# Patient Record
Sex: Female | Born: 1985 | Race: Black or African American | Hispanic: No | Marital: Single
Health system: Midwestern US, Academic
[De-identification: ages and names within clinical notes are randomized; demographics above are authoritative.]

## PROBLEM LIST (undated history)

## (undated) LAB — HIV 1+2 ANTIBODY/ANTIGEN WITH REFLEX: HIV 1+2 AB/AGN: NEGATIVE

---

## 2011-04-06 IMAGING — US US TRANSVAGINAL NON-OB
1 series · 14 of 25 positions shown · non-contrast
Comparison: 06/12/2008 obstetric ultrasound

CLINICAL DATA: Abdominal and pelvic pain, fever, vaginal bleeding,
status post miscarriage

TRANSABDOMINAL AND TRANSVAGINAL ULTRASOUND OF PELVIS
TECHNIQUE: Both transabdominal and transvaginal ultrasound
examinations of the pelvis were performed including evaluation of
the uterus, ovaries, adnexal regions, and pelvic cul-de-sac.

[Series 1: us pelvis complete modify · 14 of 40 slices shown]
[im 1/40]
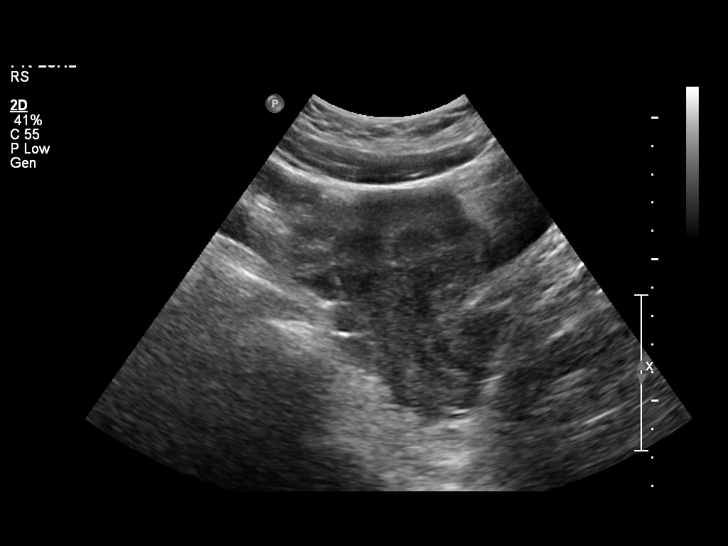
[im 4/40]
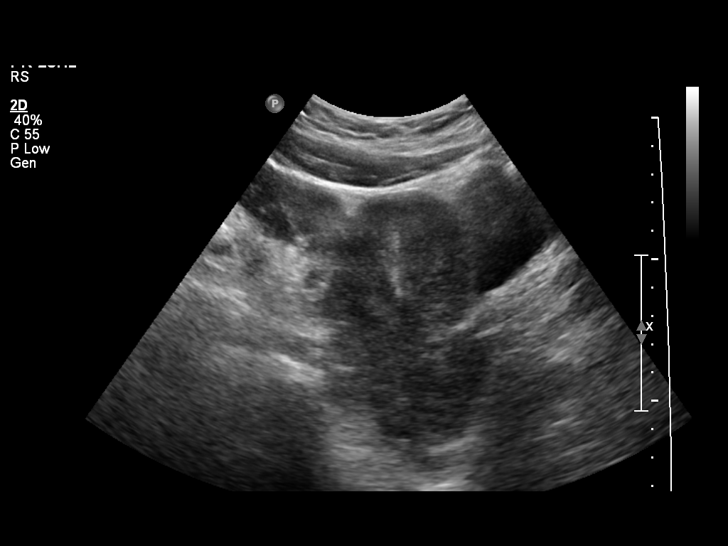
[im 7/40]
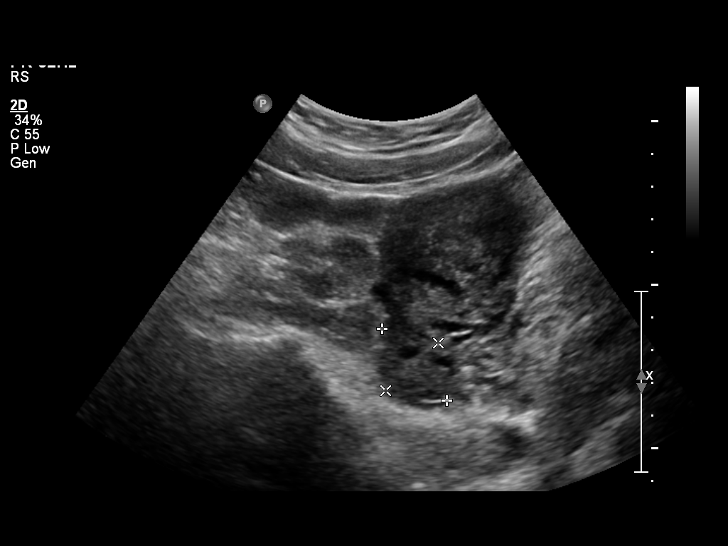
[im 10/40]
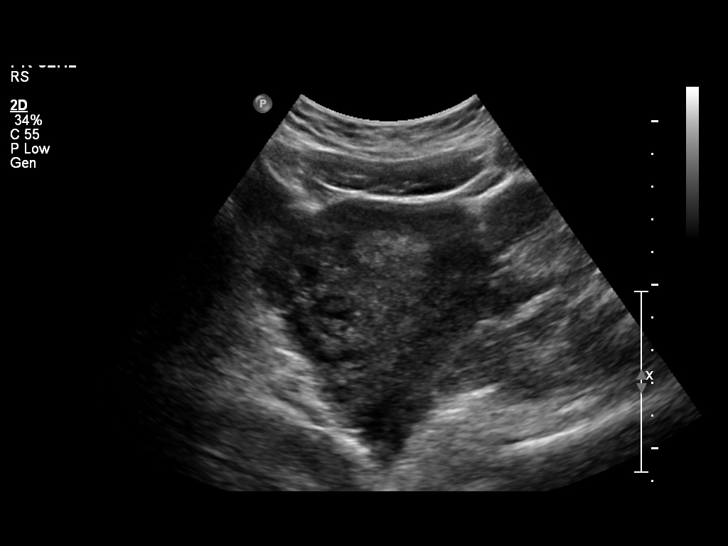
[im 14/40]
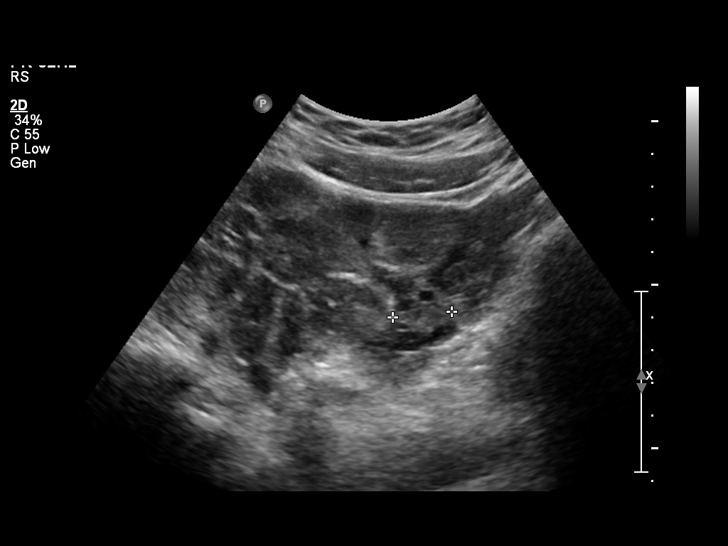
[im 15/40]
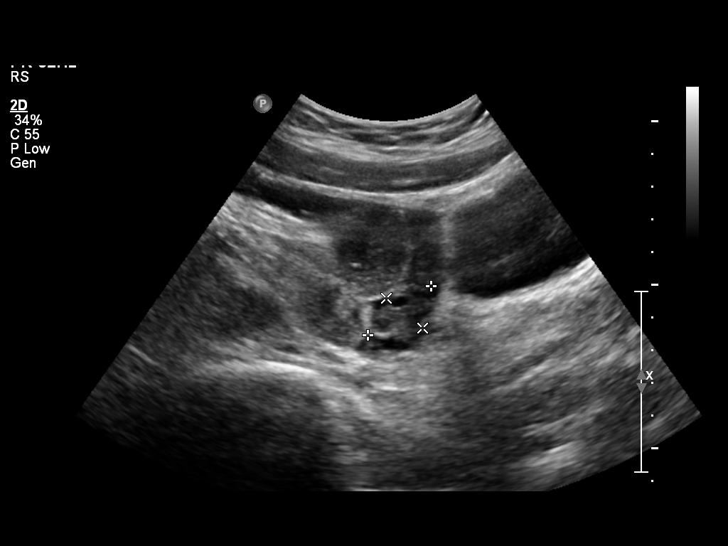
[im 18/40]
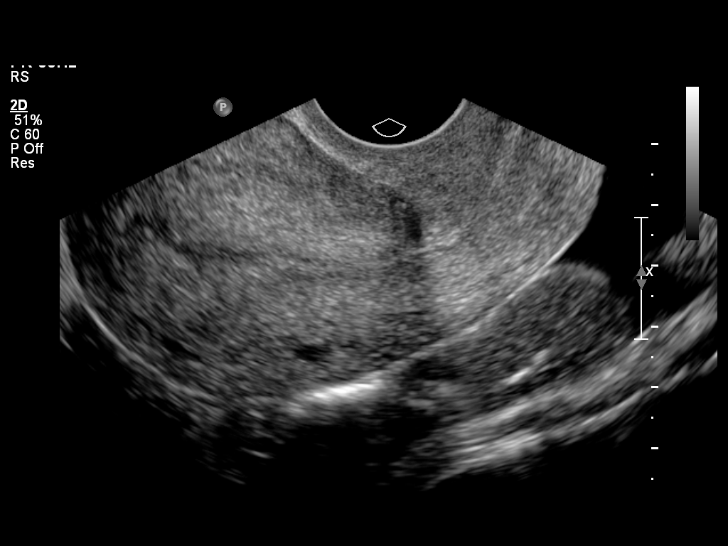
[im 22/40]
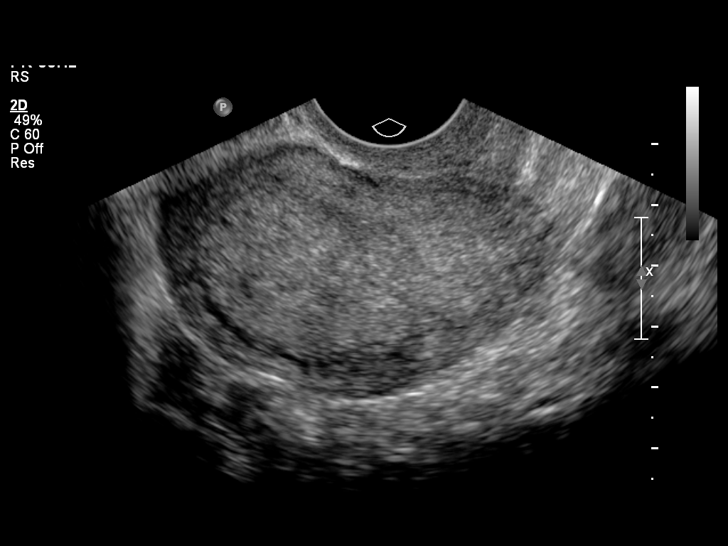
[im 25/40]
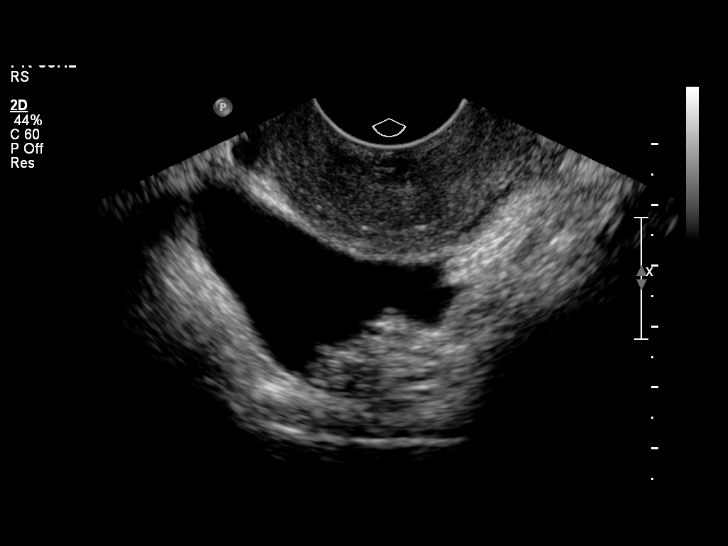
[im 27/40]
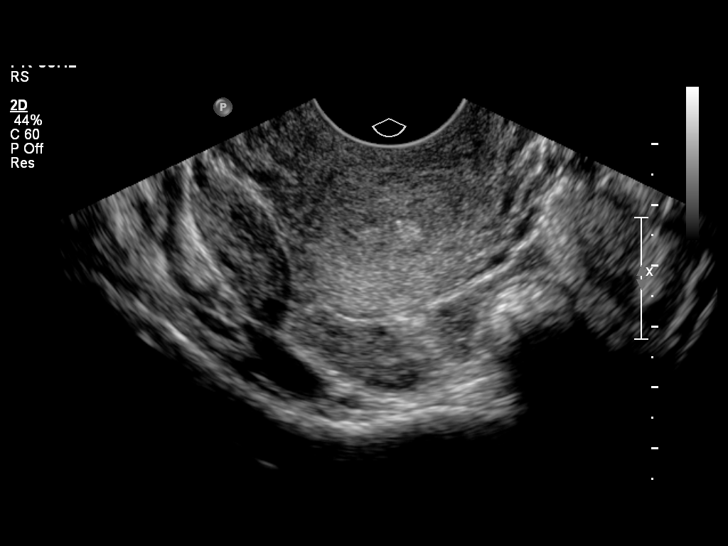
[im 30/40]
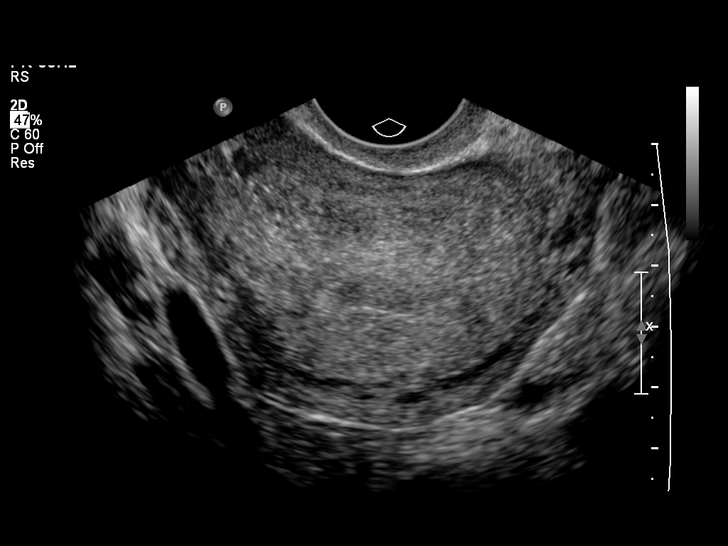
[im 33/40]
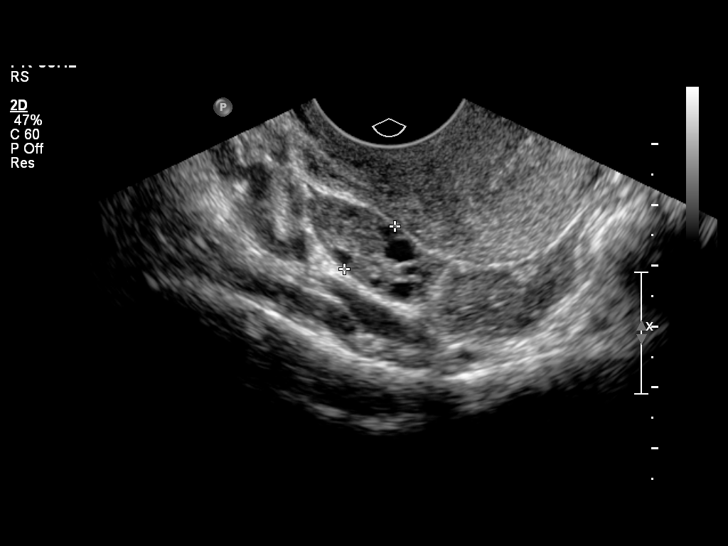
[im 36/40]
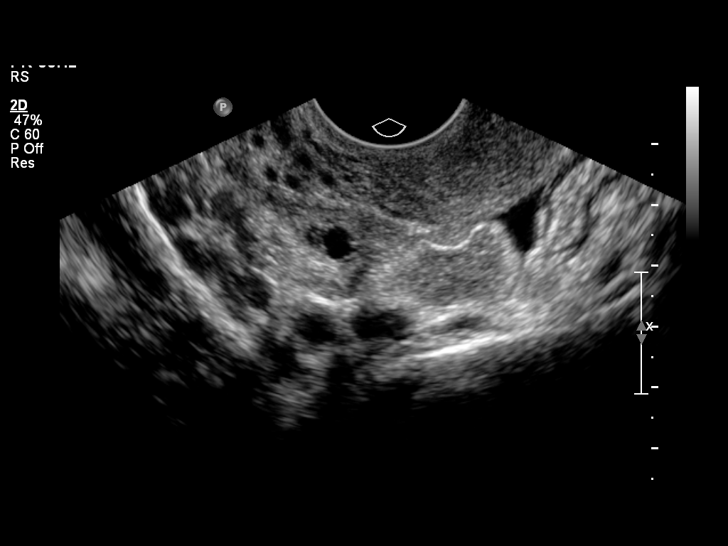
[im 40/40]
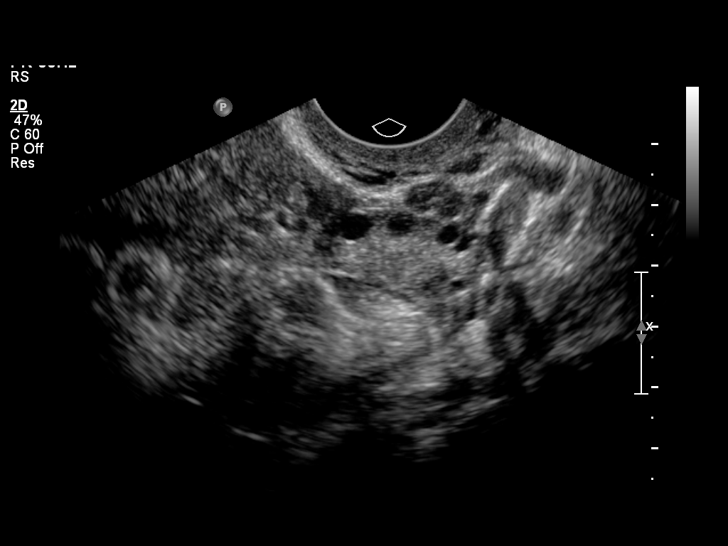

[14 of 25 positions shown; findings below may reference images not displayed]

FINDINGS: Uterus:  9.3 cm length by 3.5 cm AP by 5.6 cm transverse.  No
uterine mass or fluid collection.

Endometrium:  3 mm thick, normal.  No endometrial fluid.

Right Ovary:  Normal size and morphology, 1.1 x 2.8 x 1.4 cm.

Left Ovary:  Normal size and morphology, 1.9 x 2.8 x 1.2 cm.

Other Findings:  Small to moderate amount of simple free pelvic
fluid.  No adnexal masses.
IMPRESSION: Small to moderate amount of nonspecific free pelvic fluid.
Otherwise negative pelvic ultrasound.
No evidence of adnexal mass or retained products of conception.

## 2016-10-18 ENCOUNTER — Inpatient Hospital Stay
Admit: 2016-10-18 | Discharge: 2016-10-18 | Disposition: A | Discharge status: Home / Self Care | Attending: Emergency Medicine

## 2016-10-18 DIAGNOSIS — L0291 Cutaneous abscess, unspecified: Secondary | ICD-10-CM

## 2016-10-18 LAB — CBC WITH AUTO DIFFERENTIAL
Basophils %: 0.4 %
Basophils Absolute: 0 10*3/uL (ref 0.0–0.2)
Eosinophils %: 5 %
Eosinophils Absolute: 0.5 10*3/uL (ref 0.0–0.6)
Hematocrit: 46.7 % (ref 36.0–48.0)
Hemoglobin: 15.3 g/dL (ref 12.0–16.0)
Lymphocytes %: 31 %
Lymphocytes Absolute: 2.8 10*3/uL (ref 1.0–5.1)
MCH: 29.7 pg (ref 26.0–34.0)
MCHC: 32.6 g/dL (ref 31.0–36.0)
MCV: 90.9 fL (ref 80.0–100.0)
MPV: 7.6 fL (ref 5.0–10.5)
Monocytes %: 7.4 %
Monocytes Absolute: 0.7 10*3/uL (ref 0.0–1.3)
Neutrophils %: 56.2 %
Neutrophils Absolute: 5.2 10*3/uL (ref 1.7–7.7)
Platelets: 289 10*3/uL (ref 135–450)
RBC: 5.14 M/uL (ref 4.00–5.20)
RDW: 13.9 % (ref 12.4–15.4)
WBC: 9.2 10*3/uL (ref 4.0–11.0)

## 2016-10-18 LAB — BASIC METABOLIC PANEL W/ REFLEX TO MG FOR LOW K
Anion Gap: 15 (ref 3–16)
BUN: 10 mg/dL (ref 7–20)
CO2: 18 mmol/L — ABNORMAL LOW (ref 21–32)
Calcium: 9.9 mg/dL (ref 8.3–10.6)
Chloride: 101 mmol/L (ref 99–110)
Creatinine: 0.9 mg/dL (ref 0.6–1.1)
GFR African American: >60 (ref >60)
GFR Non-African American: >60 (ref >60)
Glucose: 92 mg/dL (ref 70–99)
Potassium reflex Magnesium: 4.3 mmol/L (ref 3.5–5.1)
Sodium: 134 mmol/L — ABNORMAL LOW (ref 136–145)

## 2016-10-18 LAB — LACTIC ACID: Lactic Acid: 1.2 mmol/L (ref 0.4–2.0)

## 2016-10-18 MED ORDER — CEPHALEXIN 500 MG PO CAPS
500 MG | ORAL_CAPSULE | Freq: Three times a day (TID) | ORAL | 0 refills | Status: AC
Start: 2016-10-18 — End: 2016-10-28

## 2016-10-18 MED ORDER — SODIUM CHLORIDE 0.9 % IV BOLUS
0.9 % | Freq: Once | INTRAVENOUS | Status: AC
Start: 2016-10-18 — End: 2016-10-18
  Administered 2016-10-18: 18:00:00 1000 mL via INTRAVENOUS

## 2016-10-18 MED ORDER — ONDANSETRON 4 MG PO TBDP
4 MG | ORAL_TABLET | Freq: Four times a day (QID) | ORAL | 0 refills | Status: AC | PRN
Start: 2016-10-18 — End: ?

## 2016-10-18 MED ORDER — VANCOMYCIN 1500 MG D5W 250 ML IVPB
Freq: Once | Status: AC
Start: 2016-10-18 — End: 2016-10-18
  Administered 2016-10-18: 17:00:00 1500 mg via INTRAVENOUS

## 2016-10-18 NOTE — ED Provider Notes (Signed)
[facility] EMERGENCY DEPT  eMERGENCY dEPARTMENT eNCOUnter        Pt Name: Laura Mcknight  MRN: [id number]  Birthdate 22-Jan-1986  Date of evaluation: 10/18/2016  Provider: Synetta Fail, MD  PCP: No primary care provider on file.      CHIEF COMPLAINT       Chief Complaint   Patient presents with   . Abscess     left shoulder, not improving after being on antibiotics since Friday       HISTORY OF PRESENT ILLNESS   (Location/Symptom, Timing/Onset, Context/Setting, Quality, Duration, Modifying Factors, Severity)  Note limiting factors.     Laura Mcknight is a 31 y.o. female       Location/Symptom: Abscess left shoulder  Timing/Onset: One week  Context/Setting: Patient went to an urgent care on Friday where she had an incision and drainage.  She went back there today for recheck.  Patient was describing some nausea and just not feeling well.  Patient had a low-grade fevers so the provider there was concerned enough to send her over here to be evaluated to make sure there is no signs of sepsis.  Quality: Left shoulder is sore.  Overall the patient states it's much improved from the beginning.  It still hurts.  I spoke to the nurse practitioner who saw her and basically because of the location she was concerned she wasn't able to deloculate enough.  Duration: This is going on about a week.  Modifying Factors: Overall it is better since she had an incision and drainage but as above the provider was concerned it was not improving enough  Severity: 3 out of 10    Nursing Notes were all reviewed and agreed with or any disagreements were addressed  in the HPI.    REVIEW OF SYSTEMS    (2-9 systems for level 4, 10 or more for level 5)     Review of Systems   Constitutional: Positive for fever. Negative for chills and fatigue.   HENT: Negative for ear pain, rhinorrhea and sore throat.    Eyes: Negative for pain, discharge and visual disturbance.   Respiratory: Negative for cough, shortness of breath and wheezing.     Cardiovascular: Negative for chest pain, palpitations and leg swelling.   Gastrointestinal: Positive for nausea. Negative for abdominal pain, diarrhea and vomiting.   Genitourinary: Negative for difficulty urinating, dysuria, pelvic pain and vaginal discharge.   Musculoskeletal: Negative for arthralgias, back pain, joint swelling and neck pain.   Skin: Positive for wound. Negative for rash.   Allergic/Immunologic: Negative for environmental allergies.   Neurological: Negative for dizziness, seizures, syncope and headaches.   Hematological: Negative for adenopathy.   Psychiatric/Behavioral: Negative for dysphoric mood and suicidal ideas. The patient is not nervous/anxious.          PAST MEDICAL HISTORY     Past Medical History:   Diagnosis Date   . Edema          SURGICAL HISTORY     History reviewed. No pertinent surgical history.      CURRENT MEDICATIONS       Discharge Medication List as of 10/18/2016  3:48 PM          ALLERGIES     Patient has no known allergies.    FAMILY HISTORY     History reviewed. No pertinent family history.       SOCIAL HISTORY       Social History     Social History   .  Marital status: Single     Spouse name: N/A   . Number of children: N/A   . Years of education: N/A     Social History Main Topics   . Smoking status: Never Smoker   . Smokeless tobacco: Never Used   . Alcohol use No   . Drug use: Unknown   . Sexual activity: Not Asked     Other Topics Concern   . None     Social History Narrative   . None       SCREENINGS             PHYSICAL EXAM    (up to 7 for level 4, 8 or more for level 5)     ED Triage Vitals [10/18/16 1159]   BP Temp Temp Source Pulse Resp SpO2 Height Weight   (!) 146/92 100.1 F (37.8 C) Oral 101 16 100 % 5' 3.75" (1.619 m) 220 lb (99.8 kg)      height is 5' 3.75" (1.619 m) and weight is 220 lb (99.8 kg). Her oral temperature is 100.1 F (37.8 C). Her blood pressure is 118/71 and her pulse is 83. Her respiration is 21 and oxygen saturation is 100%.     Physical  Exam   Constitutional: She is oriented to person, place, and time. She appears well-developed and well-nourished.   HENT:   Head: Normocephalic and atraumatic.   Right Ear: External ear normal.   Left Ear: External ear normal.   Eyes: Conjunctivae are normal. Right eye exhibits no discharge. Left eye exhibits no discharge. No scleral icterus.   Neck: Normal range of motion. No tracheal deviation present.   Pulmonary/Chest: Effort normal. No stridor. No respiratory distress.   Musculoskeletal: Normal range of motion.   The patient has a healing abscess above the left shoulder.  Medial to the joint itself.  She has good range of motion of the shoulder and no indication to suggest a septic joint.  There is the area that had been incised and drained at this point just measures about 4 mm.  Does have slight purulent drainage.  There is surrounding induration but no fluctuance there is no erythema and no warmth.   Neurological: She is alert and oriented to person, place, and time. Coordination normal.   Skin: Skin is warm and dry. She is not diaphoretic.   Psychiatric: She has a normal mood and affect. Her behavior is normal.   Nursing note and vitals reviewed.      DIAGNOSTIC RESULTS   LABS:    Results for orders placed or performed during the hospital encounter of 10/18/16   CBC auto differential   Result Value Ref Range    WBC 9.2 4.0 - 11.0 K/uL    RBC 5.14 4.00 - 5.20 M/uL    Hemoglobin 15.3 12.0 - 16.0 g/dL    Hematocrit 26.3 33.5 - 48.0 %    MCV 90.9 80.0 - 100.0 fL    MCH 29.7 26.0 - 34.0 pg    MCHC 32.6 31.0 - 36.0 g/dL    RDW 45.6 25.6 - 38.9 %    Platelets 289 135 - 450 K/uL    MPV 7.6 5.0 - 10.5 fL    Neutrophils % 56.2 %    Lymphocytes % 31.0 %    Monocytes % 7.4 %    Eosinophils % 5.0 %    Basophils % 0.4 %    Neutrophils # 5.2 1.7 - 7.7 K/uL  Lymphocytes # 2.8 1.0 - 5.1 K/uL    Monocytes # 0.7 0.0 - 1.3 K/uL    Eosinophils # 0.5 0.0 - 0.6 K/uL    Basophils # 0.0 0.0 - 0.2 K/uL   Basic Metabolic Panel w/  Reflex to MG   Result Value Ref Range    Sodium 134 (L) 136 - 145 mmol/L    Potassium reflex Magnesium 4.3 3.5 - 5.1 mmol/L    Chloride 101 99 - 110 mmol/L    CO2 18 (L) 21 - 32 mmol/L    Anion Gap 15 3 - 16    Glucose 92 70 - 99 mg/dL    BUN 10 7 - 20 mg/dL    CREATININE 0.9 0.6 - 1.1 mg/dL    GFR Non-African American >60 >60    GFR African American >60 >60    Calcium 9.9 8.3 - 10.6 mg/dL   Lactic acid, plasma   Result Value Ref Range    Lactic Acid 1.2 0.4 - 2.0 mmol/L       All other labs were within normal range or not returned as of this dictation.    EKG: All EKG's are interpreted by the Emergency Department Physician who either signs or Co-signs this chart in the absence of a cardiologist.    none    RADIOLOGY:   Non-plain film images such as CT, Ultrasound and MRI are read by the radiologist. Plain radiographic images are visualized and preliminarily interpreted by the  ED Provider with the below findings:    none        PROCEDURES   Unless otherwise noted below, none     Procedures    CRITICAL CARE TIME   N/A    CONSULTS:  None    EMERGENCY DEPARTMENT COURSE and DIFFERENTIAL DIAGNOSIS/MDM:   Vitals:    Vitals:    10/18/16 1401 10/18/16 1448 10/18/16 1532 10/18/16 1554   BP: 125/75 (!) 129/101 118/71    Pulse: 79 92 83 83   Resp: 21 16 18 21    Temp:       TempSrc:       SpO2: 100% 100% 100% 100%   Weight:       Height:           Patient was given the following medications:  Medications   vancomycin (VANCOCIN) 1500 mg in dextrose 5 % 250 mL IVPB (0 mg Intravenous Stopped 10/18/16 1554)   0.9 % sodium chloride bolus (0 mLs Intravenous Stopped 10/18/16 1554)       The absolute best thing for this may be to re-open It to deloculate it better and pack it.  The patient really does not like that idea.  She requested that we wait to see what her labs showed indicate any evidence of sepsis.  Her preference would be to take a dose of IV antibiotics then I would add Keflex to her regimen and see how it does over the next  24-48 hours.  I did explain to the patient that if it did get worse and at that point her hand would be for some he really would need to reopen this.  Also if she shows significant loose a leukocytosis or elevated lactate that I would strongly recommend we reopen this now.      FINAL IMPRESSION      1. Abscess          DISPOSITION/PLAN   DISPOSITION Decision To Discharge 10/18/2016 03:47:19 PM  PATIENT REFERRED TO:  [facility]  [address] [place] [address]  [phone]  In 2 days  If symptoms worsen      DISCHARGE MEDICATIONS:  Discharge Medication List as of 10/18/2016  3:48 PM      START taking these medications    Details   cephALEXin (KEFLEX) 500 MG capsule Take 1 capsule by mouth 3 times daily for 10 days, Disp-30 capsule, R-0Print      ondansetron (ZOFRAN ODT) 4 MG disintegrating tablet Take 1 tablet by mouth 4 times daily as needed for Nausea or Vomiting, Disp-20 tablet, R-0Print             DISCONTINUED MEDICATIONS:  Discharge Medication List as of 10/18/2016  3:48 PM                 (Please note that portions of this note were completed with a voice recognition program.  Efforts were made to edit the dictations but occasionally words are mis-transcribed.)    Synetta Fail, MD (electronically signed)            Synetta Fail, MD  10/18/16 2038

## 2016-10-18 NOTE — Discharge Instructions (Signed)
Frequent warm soaks.  Get in shower and let water hit this 2-3 times per day.  Continue Bactrim

## 2018-10-18 NOTE — Telephone Encounter (Signed)
Patient is requesting a refill on their medication.    Medication Last Filled: 0  Last Office Visit: 0/0/00  Future Office Visit: 11/30/2018  90 day supply (yes/no):no      Recent Lab Results:  No results found for: TSH, VITD25H, LIPIDS, CREATINEP  No results found for: [facility]    Pharmacy Verified.yes

## 2018-10-18 NOTE — Telephone Encounter (Signed)
Can we confirm pt has no allergies to medication please?

## 2018-10-18 NOTE — Telephone Encounter (Signed)
appt for new pt was made back in may

## 2018-10-18 NOTE — Unmapped (Signed)
Future Appointments   Date Time Provider Department Center   11/30/2018  9:00 AM Foy Guadalajara, MD [facility]     HCTZ 24 mg once daily

## 2018-10-18 NOTE — Telephone Encounter (Signed)
Ok to prep a HCTZ 25mg  supply to get her to our appointment. If  no-shows then will not write any further scripts

## 2018-10-18 NOTE — Unmapped (Signed)
[facility] doing [facility] and gave pt 90-day supply of meds back in may.  Pt will now run out of hctz 25mg  once daily.      Old clinic now that they are not in primary care refuses to fill to get pt to appt.      curiois if we could rx until new pt appt    How would we want to proceed in this?    Future Appointments   Date Time Provider Department Center   11/30/2018  9:00 AM Foy Gu[place], MD [facility]       Telephone Information:   Mobile [phone]     Verified pharmacy

## 2018-10-18 NOTE — Unmapped (Signed)
When was her appt made w me and what medications does she need?

## 2018-10-19 MED ORDER — hydroCHLOROthiazide (HYDRODIURIL) 25 MG tablet
25 | ORAL_TABLET | Freq: Every day | ORAL | 0 refills | 90.00000 days | Status: AC
Start: 2018-10-19 — End: 2018-12-13

## 2018-10-19 NOTE — Telephone Encounter (Signed)
Spoke with pt, she has NKDA, is not taking any other prescription meds, and pharmacy is verified.

## 2018-11-30 ENCOUNTER — Other Ambulatory Visit: Admit: 2018-11-30 | Payer: MEDICAID

## 2018-11-30 ENCOUNTER — Ambulatory Visit: Admit: 2018-11-30 | Payer: MEDICAID

## 2018-11-30 DIAGNOSIS — Z0000 Encounter for general adult medical examination without abnormal findings: Secondary | ICD-10-CM

## 2018-11-30 LAB — HEMOGLOBIN A1C: Hemoglobin A1C: 5.8 % — ABNORMAL HIGH (ref 4.0–5.6)

## 2018-11-30 LAB — CBC
Hematocrit: 42.5 % (ref 35.0–45.0)
Hemoglobin: 14.2 g/dL (ref 11.7–15.5)
MCH: 29.5 pg (ref 27.0–33.0)
MCHC: 33.3 g/dL (ref 32.0–36.0)
MCV: 88.5 fL (ref 80.0–100.0)
MPV: 8 fL (ref 7.5–11.5)
Platelets: 293 10*3/uL (ref 140–400)
RBC: 4.81 10*6/uL (ref 3.80–5.10)
RDW: 13.8 % (ref 11.0–15.0)
WBC: 9.2 10*3/uL (ref 3.8–10.8)

## 2018-11-30 LAB — COMPREHENSIVE METABOLIC PANEL
ALT: 19 U/L (ref 7–52)
AST: 19 U/L (ref 13–39)
Albumin: 4.6 g/dL (ref 3.5–5.7)
Alkaline Phosphatase: 72 U/L (ref 36–125)
Anion Gap: 7 mmol/L (ref 3–16)
BUN: 11 mg/dL (ref 7–25)
CO2: 28 mmol/L (ref 21–33)
Calcium: 10 mg/dL (ref 8.6–10.3)
Chloride: 100 mmol/L (ref 98–110)
Creatinine: 0.83 mg/dL (ref 0.60–1.30)
Glucose: 85 mg/dL (ref 70–100)
Osmolality, Calculated: 279 mosm/kg (ref 278–305)
Potassium: 4.1 mmol/L (ref 3.5–5.3)
Sodium: 135 mmol/L (ref 133–146)
Total Bilirubin: 0.4 mg/dL (ref 0.0–1.5)
Total Protein: 8.2 g/dL (ref 6.4–8.9)
eGFR AA CKD-EPI: >90 See note.
eGFR NONAA CKD-EPI: >90 See note.

## 2018-11-30 LAB — LIPID PANEL
Cholesterol, Total: 271 mg/dL — ABNORMAL HIGH (ref 0–200)
HDL: 56 mg/dL — ABNORMAL LOW (ref 60–92)
LDL Cholesterol: 185 mg/dL
Triglycerides: 152 mg/dL — ABNORMAL HIGH (ref 10–149)

## 2018-11-30 LAB — VITAMIN D 25 HYDROXY: Vit D, 25-Hydroxy: 21 ng/mL (ref 30.0–100.0)

## 2018-11-30 NOTE — Unmapped (Signed)
[facility]  [facility]  [address]  [place] [address]    Name:  Carol Moss      Date of Birth: [birth year]   MRN: [id number]    Date of Service:  11/30/2018       Subjective:     Chief Complaint   Patient presents with   ??? Establish Care     relocated from [place]       Carol Moss is a 33 y.o. female here to establish care.    Acute Concerns  #Stress/Anxiety  - is single mom of 4 and going to school full time and works full time  - does not have much support in the city as well    Chronic Issues  #Elevated Blood Pressure  - reportedly had 11/2012 with normal TTE (cannot see result)  - takes HCTZ 25mg  daily   BP Readings from Last 3 Encounters:   11/30/18 139/86     #Overweight  Wt Readings from Last 3 Encounters:   11/30/18 (!) 237 lb (107.5 kg)         Depression screening: performed, negative  Alcohol misuse screening: performed, negative    Health Care Maintenance  Health Maintenance Summary                Hepatitis C Screening (MyChart) Overdue 1983/04/14     Diabetes Screening Overdue 06-16-1983     Tobacco Cessation Readiness Overdue Dec 01, 1983     Comprehensive Physical Exam Overdue Jul 01, 1983     Immunization: Pneumococcal Overdue 07/20/1989     Alcohol Misuse Screening Overdue 07/20/2001     HIV Screening Overdue 07/20/2001     Depression Screening Overdue 07/20/2001     Immunization: DTaP/Tdap/Td Overdue 07/21/2002     Lipid Panel Overdue 07/21/2003     Cervical Cancer Screening/Pap Smear (MyChart) Overdue 02/03/2018      Performed Elsewhere 02/04/2015 negative HPV and cytology    Immunization: Influenza Next Due 11/01/2018           History  No past medical history on file.    Past Surgical History:   Procedure Laterality Date   ??? CESAREAN SECTION  2006       Family History   Problem Relation Age of Onset   ??? Diabetes Mother    ??? Hypertension Mother    ??? Diabetes Maternal Aunt    ??? Diabetes Maternal Uncle    ??? Diabetes Maternal Grandmother        Social History      Socioeconomic History   ??? Marital status: Single     Spouse name: None   ??? Number of children: None   ??? Years of education: None   ??? Highest education level: None   Occupational History   ??? None   Social Needs   ??? Financial resource strain: None   ??? Food insecurity     Worry: None     Inability: None   ??? Transportation needs     Medical: None     Non-medical: None   Tobacco Use   ??? Smoking status: Current Some Day Smoker     Types: Cigarettes   ??? Smokeless tobacco: Never Used   Substance and Sexual Activity   ??? Alcohol use: Yes     Frequency: 2-4 times a month     Drinks per session: 3 or 4     Binge frequency: Less than monthly   ??? Drug use: Not Currently   ???  Sexual activity: None   Lifestyle   ??? Physical activity     Days per week: None     Minutes per session: None   ??? Stress: None   Relationships   ??? Social Wellsite geologist on phone: None     Gets together: None     Attends religious service: None     Active member of club or organization: None     Attends meetings of clubs or organizations: None     Relationship status: None   ??? Intimate partner violence     Fear of current or ex partner: None     Emotionally abused: None     Physically abused: None     Forced sexual activity: None   Other Topics Concern   ??? Caffeine Use Not Asked   ??? Occupational Exposure Not Asked   ??? Exercise Not Asked   ??? Seat Belt Not Asked   Social History Narrative   ??? None       Current Outpatient Medications   Medication Sig Dispense Refill   ??? hydroCHLOROthiazide (HYDRODIURIL) 25 MG tablet Take 1 tablet (25 mg total) by mouth daily for 45 days. 45 tablet 0     No current facility-administered medications for this visit.        Review of Systems    Objective:     Vitals:    11/30/18 0909 11/30/18 1007   BP: (!) 145/92 139/86   Pulse: 104 75   Weight: (!) 237 lb (107.5 kg)    Height: 5' 4 (1.626 m)        Physical Exam  Constitutional:       General: Carol Moss is not in acute distress.     Appearance: Carol Moss is well-developed.   HENT:       Head: Normocephalic and atraumatic.      Right Ear: External ear normal.      Left Ear: External ear normal.   Eyes:      Conjunctiva/sclera: Conjunctivae normal.   Neck:      Musculoskeletal: Normal range of motion and neck supple.   Cardiovascular:      Rate and Rhythm: Normal rate and regular rhythm.      Heart sounds: Normal heart sounds. No murmur.   Pulmonary:      Effort: Pulmonary effort is normal. No respiratory distress.      Breath sounds: Normal breath sounds.   Abdominal:      General: There is no distension.      Palpations: Abdomen is soft.      Tenderness: There is no abdominal tenderness.   Musculoskeletal: Normal range of motion.   Skin:     General: Skin is warm.      Findings: No rash.   Neurological:      Mental Status: Carol Moss is alert and oriented to person, place, and time.           Assessment/Plan:     Carol Moss is a 33 y.o. female here for to establish care.     Carol Moss was seen today for establish care.    Diagnoses and all orders for this visit:    Well adult exam  -     Lipid Profile; Future  -     Comprehensive metabolic panel; Future  -     Hemoglobin A1c; Future  -     CBC; Future  -     Vitamin D 25 hydroxy; Future  Elevated BP without diagnosis of hypertension  - on HCTZ 25mg   - continue to monitor    Swelling  - continue HCTZ 25mg     Anxiety  - referred to local psychologist  - follow up in 2 months      Return in about 2 months (around 01/30/2019) for telehealth.    Lady Gary MD  Internal Medicine-Pediatrics

## 2018-11-30 NOTE — Patient Instructions (Signed)
Psychiatrist (can prescribe medications)  [facility]  [address], [place], [place] [address]  [email or web address]  [phone]    [facility]   Toll-free [phone]   Can also be reached by email at [email or web address].    [facility]  [address]  [place], [place] [address]  [phone]    Psychologists/Therapists (provide therapy but no medications)  For therapy we will give you the following website:  [email or web address]    Jennifer S. Browne, PHD  Clinical Psychologist  [address], [place], [place] [address]  Phone: [phone]    Janet D. Castellini, Psy.D., C.S.  [facility]  Phone: [phone]    Jaime B Willis, Psy.D.  [address]  [phone]    Valerie Allen  [address], [place], [place] [address]  [phone]      [place]  [phone]  Nicole Piersma MA, LPCC-S  [place]: [address], [address], [place], [place] [address]    [place] [facility]  [place]: [address], [place], [place] [address]  [place]: [address], [address], [place], [place] [address]  [place]: [address], [place], [place] [address]  [place]: [facility] - [address], [address], [place], [address].

## 2018-12-01 NOTE — Telephone Encounter (Signed)
Called patient to discuss test results. Did not answer so left VM    - kidney function and liver function are normal  - blood counts normal  - lipids elevated (recommended re-testing)  - A1c slightly elevated to 5.8    Thanks    Lady Gary MD  Internal Medicine-Pediatrics

## 2018-12-13 MED ORDER — hydroCHLOROthiazide (HYDRODIURIL) 25 MG tablet
25 | ORAL_TABLET | ORAL | 0 refills | 90.00000 days | Status: AC
Start: 2018-12-13 — End: 2019-03-16

## 2018-12-13 NOTE — Unmapped (Signed)
Patient is requesting a refill on their medication.    Medication Last Filled: 10/19/2018  Last Office Visit:11/30/2018   Future Office Visit: 02/02/2019  90 day supply (yes/no):yes      Recent Lab Results:  Vit D, 25-Hydroxy   Date Value Ref Range Status   11/30/2018 21.0 (L) 30.0 - 100.0 ng/mL Final     Comment:     Vitamin D deficiency has been defined by the Institute of Medicine (IOM) and an Endocrine Society practice guideline as a level of serum 25-[place] Vitamin D less than 20 ng/mL.    The Endocrine Society went on to further define Vitamin D insufficiency as a level between 21-29 ng/mL.  1)  IOM. 2011 Dietary reference intakes for calcium and D.  [place]: [facility] [place]  2)  Holick MF, [place] NC, Bischoff-Ferrari HA, et al.  Evaluation, treatment, and prevention of Vitamin D deficiency: an Endocrine Society clinical practice guideline.  JCEM.  2011 Jul; 96(7):1911-30.           Lab Results   Component Value Date    HGBA1C 5.8 (H) 11/30/2018       Pharmacy Verified.yes

## 2019-02-02 ENCOUNTER — Ambulatory Visit: Admit: 2019-02-02 | Payer: MEDICAID

## 2019-02-02 DIAGNOSIS — F419 Anxiety disorder, unspecified: Secondary | ICD-10-CM

## 2019-02-02 NOTE — Unmapped (Signed)
This was a phone conversation in lieu of an in-person visit due to the COVID-19 emergency.    I spent 15 to 19 minutes  on this call conducting an interview, performing a limited exam by phone, and educating the patient on my assessment and plan.    [facility]  [facility]  [address]  [place] [address]    Name:  Carol Moss      Date of Birth: 03-Jan-1986   MRN: [id number]    Date of Service:  02/02/2019       Subjective:     Carol Moss is a 33 y.o. female here for follow up.    Last seen here 11/30/18    Since that time   - has changed her eating habits, increased exercise (daily), has prayed more  - has done some deep self reflection  - was journaling  - is able to do this in the morning before work or school (works from home)  - is single mom of 4 and going to school full time and works full time  - does not have much support in the city as well  ??  Chronic Issues  #HTN  - reportedly had 11/2012 with normal TTE (cannot see result)  - takes HCTZ 25mg  daily   BP Readings from Last 3 Encounters:   11/30/18 139/86     #Dyslipidemia  Lab Results   Component Value Date    CHOLTOT 271 (H) 11/30/2018    TRIG 152 (H) 11/30/2018    HDL 56 (L) 11/30/2018    LDL 185 11/30/2018     #Vitamin D  Lab Results   Component Value Date    VITD25H 21.0 (L) 11/30/2018         Review of Systems    Objective:   There were no vitals filed for this visit.    Physical Exam  Pulmonary:      Effort: Pulmonary effort is normal.   Neurological:      Mental Status: She is alert.   Psychiatric:         Mood and Affect: Mood normal.         Behavior: Behavior normal.         Thought Content: Thought content normal.           Assessment/Plan:     Carol Moss is a 33 y.o. female here for follow up regarding mental health.    #Anxiety and Depression  - has improved since last visit with life and health changes personally  - advised to be able to schedule with a therapist as well to make sure she is  able to maintain as she has a lot of stress    #Dyslipidemia  - needs to repeat at next visit.    #Prediabetes  - repeat A1c at next visit    Lady Gary MD  Internal Medicine-Pediatrics

## 2019-03-16 MED ORDER — hydroCHLOROthiazide (HYDRODIURIL) 25 MG tablet
25 | ORAL_TABLET | ORAL | 0 refills | 90.00000 days | Status: AC
Start: 2019-03-16 — End: 2019-06-26

## 2019-03-16 NOTE — Unmapped (Signed)
Patient is requesting a refill on their medication.    Medication Last Filled: 12/13/18  Last Office Visit: 02/02/19  Future Office Visit: 05/03/2019  90 day supply (yes/no):yes      Recent Lab Results:  Vit D, 25-Hydroxy   Date Value Ref Range Status   11/30/2018 21.0 (L) 30.0 - 100.0 ng/mL Final     Comment:     Vitamin D deficiency has been defined by the Institute of Medicine (IOM) and an Endocrine Society practice guideline as a level of serum 25-[place] Vitamin D less than 20 ng/mL.    The Endocrine Society went on to further define Vitamin D insufficiency as a level between 21-29 ng/mL.  1)  IOM. 2011 Dietary reference intakes for calcium and D.  [place]: [facility] [place]  2)  Holick MF, [place] NC, Bischoff-Ferrari HA, et al.  Evaluation, treatment, and prevention of Vitamin D deficiency: an Endocrine Society clinical practice guideline.  JCEM.  2011 Jul; 96(7):1911-30.           Lab Results   Component Value Date    HGBA1C 5.8 (H) 11/30/2018       Pharmacy Verified.yes

## 2019-05-03 ENCOUNTER — Ambulatory Visit: Admit: 2019-05-03 | Payer: MEDICAID

## 2019-05-03 ENCOUNTER — Other Ambulatory Visit: Admit: 2019-05-03 | Payer: MEDICAID

## 2019-05-03 DIAGNOSIS — R7303 Prediabetes: Secondary | ICD-10-CM

## 2019-05-03 DIAGNOSIS — E785 Hyperlipidemia, unspecified: Secondary | ICD-10-CM

## 2019-05-03 LAB — LIPID PANEL
Cholesterol, Total: 240 mg/dL (ref 0–200)
HDL: 47 mg/dL (ref 60–92)
LDL Cholesterol: 167 mg/dL
Triglycerides: 132 mg/dL (ref 10–149)

## 2019-05-03 LAB — VITAMIN D 25 HYDROXY: Vit D, 25-Hydroxy: 41 ng/mL (ref 30.0–100.0)

## 2019-05-03 LAB — LIPOPROTEIN, LDL DIRECT: LDL Direct: 176.8 mg/dL (ref 75.0–193.0)

## 2019-05-03 LAB — HEMOGLOBIN A1C: Hemoglobin A1C: 6.2 % (ref 4.0–5.6)

## 2019-05-03 NOTE — Unmapped (Signed)
[facility]  [facility]ARE [facility]  [address]  [place] [address]    Name:  Carol Moss      Date of Birth: 01-18-1986   MRN: [id number]    Date of Service:  05/03/2019       Subjective:     Carol Moss is a 34 y.o. female here for follow up.    Last seen here 02/02/2019    Since that time   - has changed her eating habits, increased exercise (daily), has prayed more  - has done some deep self reflection  - was journaling  - is able to do this in the morning before work or school (works from home)  - is single mom of 4 and going to school full time and works full time  - does not have much support in the city as well  ??  Chronic Issues  #HTN  - reportedly had 11/2012 with normal TTE (cannot see result)  - takes HCTZ 25mg  daily   BP Readings from Last 3 Encounters:   05/03/19 129/80   11/30/18 139/86     #Dyslipidemia  Lab Results   Component Value Date    CHOLTOT 271 (H) 11/30/2018    TRIG 152 (H) 11/30/2018    HDL 56 (L) 11/30/2018    LDL 185 11/30/2018     #Vitamin D  Lab Results   Component Value Date    VITD25H 21.0 (L) 11/30/2018     Wt Readings from Last 3 Encounters:   05/03/19 (!) 230 lb (104.3 kg)   11/30/18 (!) 237 lb (107.5 kg)         Review of Systems    Objective:     Vitals:    05/03/19 0924   BP: 129/80   Pulse: 97   Temp: 96.9 ??F (36.1 ??C)   TempSrc: Temporal   Weight: (!) 230 lb (104.3 kg)   Height: 5' 4.5 (1.638 m)       Physical Exam  Pulmonary:      Effort: Pulmonary effort is normal.   Neurological:      Mental Status: She is alert.   Psychiatric:         Mood and Affect: Mood normal.         Behavior: Behavior normal.         Thought Content: Thought content normal.           Assessment/Plan:     Carol Moss is a 34 y.o. female here for follow up regarding multiple issues    #Anxiety and Depression  - has improved since last visit with life and health changes personally    #Dyslipidemia  - check lipids and LDL    #Prediabetes  - repeat A1c    #Vitamin D  Deficiency  - check Vitamin D    Lady Gary MD  Internal Medicine-Pediatrics

## 2019-06-26 NOTE — Unmapped (Signed)
Patient is requesting a refill on their medication.    Medication Last Filled: 03/16/19  Last Office Visit: 05/03/19  Future Office Visit: 11/03/2019  90 day supply (yes/no):yes      Recent Lab Results:  Vit D, 25-Hydroxy   Date Value Ref Range Status   05/03/2019 41.0 30.0 - 100.0 ng/mL Final     Comment:     Vitamin D deficiency has been defined by the Institute of Medicine (IOM) and an Endocrine Society practice guideline as a level of serum 25-[place] Vitamin D less than 20 ng/mL.    The Endocrine Society went on to further define Vitamin D insufficiency as a level between 21-29 ng/mL.  1)  IOM. 2011 Dietary reference intakes for calcium and D.  [place]: [employer] [place]  2)  Holick MF, [place], Bischoff-Ferrari HA, et al.  Evaluation, treatment, and prevention of Vitamin D deficiency: an Endocrine Society clinical practice guideline.  JCEM.  2011 Jul; 96(7):1911-30.           Lab Results   Component Value Date    HGBA1C 6.2 (H) 05/03/2019       Pharmacy Verified.yes

## 2019-06-27 MED ORDER — hydroCHLOROthiazide (HYDRODIURIL) 25 MG tablet
25 | ORAL_TABLET | ORAL | 3 refills | 90.00000 days | Status: AC
Start: 2019-06-27 — End: 2020-07-19

## 2019-11-03 ENCOUNTER — Ambulatory Visit: Admit: 2019-11-03 | Discharge: 2019-11-07 | Payer: MEDICAID

## 2019-11-03 DIAGNOSIS — R030 Elevated blood-pressure reading, without diagnosis of hypertension: Secondary | ICD-10-CM

## 2019-11-03 NOTE — Patient Instructions (Addendum)
It was a pleasure seeing you today at the [facility]!    Keep up the good work with healthy lifestyle changes! I'm proud of your work so far!    I would recommend getting the COVID vaccine to protect yourself during this pandemic. You can get it at any pharmacy without cost.

## 2019-11-03 NOTE — Unmapped (Signed)
[facility]  [facility] HEALTH PRIMARY CARE AT [facility]  [address]  [place] [address]    Name: Carol Moss      Date of Birth: [birth year]   MRN: [id number]    Date of Service: 11/03/2019    Subjective:     Chief Complaint   Patient presents with   ??? Hypertension     follow up      History of Present Illness:  Carol Moss is a(n) 34 y.o. female with a history of HTN, HLD, prediabetes, and Vit D deficiency who is here today for the following: follow up of HTN    Last seen in clinic: 05/03/2019 Foy Guadalajara, MD for routine follow up    Current concerns:  - Weight and BP    Hypertension:  - Current regimen: HCTZ 25 mg daily   - Missed doses: Never  - Home BP checks: None  - Denies chest pain, shortness of breath, headaches, vision changes, or leg swelling    Working on diet and exercise  Has cut out most sweets  Working out 4-5 days per week     Current Outpatient Medications   Medication Sig Dispense Refill   ??? hydroCHLOROthiazide (HYDRODIURIL) 25 MG tablet TAKE 1 TABLET BY MOUTH ONCE DAILY  FOR 45 DAYS 90 tablet 3     No current facility-administered medications for this visit.       Review of Systems    Objective:     Vitals:    11/03/19 0940   BP: 115/77   Pulse: 88   Temp: 97.8 ??F (36.6 ??C)   SpO2: 97%   Height: 5' 4 (1.626 m)   Weight: (!) 225 lb 6.4 oz (102.2 kg)   BMI (Calculated): 38.67     BP Readings from Last 3 Encounters:   11/03/19 115/77   05/03/19 129/80   11/30/18 139/86     Body mass index is 38.69 kg/m??.     Wt Readings from Last 3 Encounters:   11/03/19 (!) 225 lb 6.4 oz (102.2 kg)   05/03/19 (!) 230 lb (104.3 kg)   11/30/18 (!) 237 lb (107.5 kg)       Physical Exam  Vitals reviewed.   Constitutional:       General: She is not in acute distress.     Appearance: She is well-developed.   HENT:      Head: Normocephalic and atraumatic.      Mouth/Throat:      Mouth: Mucous membranes are moist.   Eyes:      Conjunctiva/sclera: Conjunctivae normal.   Cardiovascular:       Rate and Rhythm: Normal rate and regular rhythm.      Pulses: Normal pulses.      Heart sounds: Normal heart sounds. No murmur heard.     Pulmonary:      Effort: Pulmonary effort is normal. No respiratory distress.      Breath sounds: Normal breath sounds. No wheezing.   Musculoskeletal:         General: No swelling. Normal range of motion.      Cervical back: Normal range of motion.   Skin:     General: Skin is warm and dry.   Neurological:      General: No focal deficit present.      Mental Status: She is alert and oriented to person, place, and time.   Psychiatric:         Behavior: Behavior normal.  Assessment/Plan:     Carol Moss is a 34 y.o. female here for follow up    Carol Moss was seen today for hypertension.    Diagnoses and all orders for this visit:    Elevated blood pressure reading (Primary)  Assessment & Plan:  Reports never being diagnosed with hypertension and using HCTZ for edema. She has had a few elevated BP readings in the past but normal today on diuretic. Continue HCTZ for now.       Edema of both legs  Assessment & Plan:  Stable on diuretic. Continue.       Healthcare maintenance  Assessment & Plan:  Extensively discussed risks and benefits of COVID vaccination, and strongly encouraged vaccination for herself and eligible children.           Return in about 6 months (around 05/02/2020) for Annual Wellness Visit.       Carol Canary, MD  Internal Medicine & Pediatrics      - See body of note for supportive documentation related to level of service  - Total time I spent for E/M services on the date of the encounter: 35 minutes

## 2019-11-04 NOTE — Unmapped (Signed)
Reports never being diagnosed with hypertension and using HCTZ for edema. She has had a few elevated BP readings in the past but normal today on diuretic. Continue HCTZ for now.

## 2019-11-04 NOTE — Unmapped (Signed)
Stable on diuretic. Continue.

## 2019-11-04 NOTE — Assessment & Plan Note (Signed)
Extensively discussed risks and benefits of COVID vaccination, and strongly encouraged vaccination for herself and eligible children.

## 2020-07-19 MED ORDER — hydroCHLOROthiazide (HYDRODIURIL) 25 MG tablet
25 | ORAL_TABLET | Freq: Every day | ORAL | 1 refills | 90.00000 days | Status: AC
Start: 2020-07-19 — End: 2021-01-22

## 2020-07-19 NOTE — Unmapped (Signed)
Medication pended

## 2020-07-19 NOTE — Unmapped (Signed)
Pharmacy is requesting a refill on medication.    Name of medication: hydroCHLOROthiazide (HYDRODIURIL) 25 MG tablet    Medication last filled: 06/27/19  Patient's last visit: 11/03/2019 [name], MD   Future office visit: Visit date not found         Recent Lab Results:  Vit D, 25-Hydroxy   Date Value Ref Range Status   05/03/2019 41.0 30.0 - 100.0 ng/mL Final     Comment:     Vitamin D deficiency has been defined by the Institute of Medicine (IOM) and an Endocrine Society practice guideline as a level of serum 25-[place] Vitamin D less than 20 ng/mL.    The Endocrine Society went on to further define Vitamin D insufficiency as a level between 21-29 ng/mL.  1)  IOM. 2011 Dietary reference intakes for calcium and D.  [place]: [employer] [place]  2)  Holick MF, [place] NC, Bischoff-Ferrari HA, et al.  Evaluation, treatment, and prevention of Vitamin D deficiency: an Endocrine Society clinical practice guideline.  JCEM.  2011 Jul; 96(7):1911-30.           Lab Results   Component Value Date    HGBA1C 6.2 (H) 05/03/2019       Preferred pharmacy verified with patient:     [facility] [address] [place] - [address]  [address] [place] [address]  Phone: [phone] Fax: [phone]

## 2020-07-19 NOTE — Unmapped (Signed)
Patient needs a refill on Hydrochloroithiazide 25 mg. Please send to [facility] on [facility].  Patient is completely out of medication and needs the refill completed today.   Please call patient once completed   @ [phone].

## 2021-01-22 MED ORDER — hydroCHLOROthiazide (HYDRODIURIL) 25 MG tablet
25 | ORAL_TABLET | ORAL | 0 refills | 90.00000 days | Status: AC
Start: 2021-01-22 — End: 2021-04-22

## 2021-01-22 NOTE — Unmapped (Signed)
Pharmacy is requesting a refill on medication.    Name of medication: hydrochlorthiazide    Medication last filled: 07/19/20  Patient's last visit: 11/03/2019 [name], MD   Future office visit: Visit date not found         Recent Lab Results:  Vit D, 25-Hydroxy   Date Value Ref Range Status   05/03/2019 41.0 30.0 - 100.0 ng/mL Final     Comment:     Vitamin D deficiency has been defined by the Institute of Medicine (IOM) and an Endocrine Society practice guideline as a level of serum 25-[place] Vitamin D less than 20 ng/mL.    The Endocrine Society went on to further define Vitamin D insufficiency as a level between 21-29 ng/mL.  1)  IOM. 2011 Dietary reference intakes for calcium and D.  [place] [employer] [place]  2)  Holick MF, [place], Bischoff-Ferrari HA, et al.  Evaluation, treatment, and prevention of Vitamin D deficiency: an Endocrine Society clinical practice guideline.  JCEM.  2011 Jul; 96(7):1911-30.           Lab Results   Component Value Date    HGBA1C 6.2 (H) 05/03/2019       Preferred pharmacy verified with patient:      [facility] [address] [place] - [address]  [address] [place] [address]  Phone: [phone] Fax: [phone]

## 2021-04-22 MED ORDER — hydroCHLOROthiazide (HYDRODIURIL) 25 MG tablet
25 | ORAL_TABLET | Freq: Every day | ORAL | 0 refills | 90.00000 days | Status: AC
Start: 2021-04-22 — End: 2021-05-11

## 2021-04-22 NOTE — Unmapped (Signed)
Patient has not been seen in over one year. Needs appointment for further refills.

## 2021-04-22 NOTE — Unmapped (Signed)
Pharmacy is requesting a refill on medication.    Name of medication: hydrochlorothiazide    Medication last filled: 01/22/21  Patient's last visit: 11/03/2019 Jordan HawksLisa Nicole Fioretti, MD   Future office visit: Visit date not found         Recent Lab Results:  Vit D, 25-Hydroxy   Date Value Ref Range Status   05/03/2019 41.0 30.0 - 100.0 ng/mL Final     Comment:     Vitamin D deficiency has been defined by the Institute of Medicine (IOM) and an Endocrine Society practice guideline as a level of serum 25-[place] Vitamin D less than 20 ng/mL.    The Endocrine Society went on to further define Vitamin D insufficiency as a level between 21-29 ng/mL.  1)  IOM. 2011 Dietary reference intakes for calcium and D.  [place]: [employer] [place]  2)  Holick MF, [place], Bischoff-Ferrari HA, et al.  Evaluation, treatment, and prevention of Vitamin D deficiency: an Endocrine Society clinical practice guideline.  JCEM.  2011 Jul; 96(7):1911-30.           Lab Results   Component Value Date    HGBA1C 6.2 (H) 05/03/2019       Preferred pharmacy verified with patient:     [facility] [address] - [place], [place] - [address]  [address]  [place] [id number]  Phone: [phone] Fax: [phone]

## 2021-04-30 NOTE — Unmapped (Signed)
Future Appointments   Date Time Provider Department Center   05/01/2021  2:30 PM [name], MD [facility] [place] [facility]

## 2021-05-01 ENCOUNTER — Ambulatory Visit: Admit: 2021-05-01 | Discharge: 2021-05-12 | Payer: MEDICAID

## 2021-05-01 ENCOUNTER — Other Ambulatory Visit: Admit: 2021-05-01 | Payer: MEDICAID

## 2021-05-01 DIAGNOSIS — Z0000 Encounter for general adult medical examination without abnormal findings: Secondary | ICD-10-CM

## 2021-05-01 LAB — HEPATITIS C ANTIBODY: HCV Ab: NONREACTIVE

## 2021-05-01 LAB — COMPREHENSIVE METABOLIC PANEL
ALT: 13 U/L (ref 7–52)
AST: 16 U/L (ref 13–39)
Albumin: 4.3 g/dL (ref 3.5–5.7)
Alkaline Phosphatase: 57 U/L (ref 36–125)
Anion Gap: 8 mmol/L (ref 3–16)
BUN: 12 mg/dL (ref 7–25)
CO2: 30 mmol/L (ref 21–33)
Calcium: 10.1 mg/dL (ref 8.6–10.3)
Chloride: 101 mmol/L (ref 98–110)
Creatinine: 0.91 mg/dL (ref 0.60–1.30)
EGFR: 83
Glucose: 86 mg/dL (ref 70–100)
Osmolality, Calculated: 287 mOsm/kg (ref 278–305)
Potassium: 3.8 mmol/L (ref 3.5–5.3)
Sodium: 139 mmol/L (ref 133–146)
Total Bilirubin: 0.3 mg/dL (ref 0.0–1.5)
Total Protein: 7.8 g/dL (ref 6.4–8.9)

## 2021-05-01 LAB — LIPID PANEL
Cholesterol, Total: 234 mg/dL (ref 0–200)
HDL: 44 mg/dL (ref 60–92)
LDL Cholesterol: 162 mg/dL
Non-HDL Cholesterol, Calculated: 190 mg/dL (ref 0–129)
Triglycerides: 142 mg/dL (ref 10–149)

## 2021-05-01 LAB — HEMOGLOBIN A1C: Hemoglobin A1C: 5.9 % (ref 4.0–5.6)

## 2021-05-01 NOTE — Unmapped (Addendum)
[facility]  [facility] HEALTH PRIMARY CARE AT [facility]  [address]  [place] [address]    Name: Carol Moss      Date of Birth: August 05, 1985   MRN: [id number]    Date of Service: 05/01/2021    Subjective:     Chief Complaint   Patient presents with   ??? Hypertension     History of Present Illness:  Carol Moss is a(n) 36 y.o. female with a history of HTN and prediabetes who is here today for the following: annual exam    Last seen in clinic: 11/03/2019 [name], MD     Adult Wellness:  - Nutrition: About a gallon of water per day. Working on more greens and portion control  - Physical activity: Walking more with co-workers  - Sleep: Occasional Melatonin use  - Vision, hearing, dental: Some blurry vision at times   - Substance use: Smokes about pack of cigarettes per week. Would like to quit eventually. About 1 glass of wine per week.   - Sexual health: Not sexually active currently   - Menses: Regular  - Mental health: No depression or anxiety   - Safety: No concerns    The ASCVD Risk score (Arnett DK, et al., 2019) failed to calculate for the following reasons:    The 2019 ASCVD risk score is only valid for ages 72 to 54    Health Maintenance Due   Topic Date Due   ??? Hepatitis C Screening (MyChart)  Never done   ??? Immunization: Pneumococcal (1 - PCV) Never done   ??? Cervical Cancer Screening/Pap Smear (MyChart)  02/03/2018   ??? Alcohol Misuse Screening  11/30/2019   ??? Comprehensive Physical Exam  11/30/2019   ??? Immunization: COVID-19 (3 - Booster for Pfizer series) 02/27/2020   ??? Diabetes Screening  05/02/2020   ??? Immunization: Influenza (MyChart) (1) 10/31/2020   ??? Immunization: DTaP/Tdap/Td (6 - Td or Tdap) 11/27/2020       Current Outpatient Medications   Medication Sig Dispense Refill   ??? hydroCHLOROthiazide (HYDRODIURIL) 25 MG tablet Take 1 tablet (25 mg total) by mouth daily. Please schedule appointment for further refills. 30 tablet 0     No current facility-administered  medications for this visit.       Review of Systems    Objective:     Vitals:    05/01/21 1446   BP: 116/64   Pulse: 84   SpO2: 100%   Weight: (!) 229 lb 9.6 oz (104.1 kg)       Body mass index is 39.41 kg/m??.    Physical Exam  Vitals reviewed.   Constitutional:       General: She is not in acute distress.     Appearance: She is well-developed.   HENT:      Right Ear: Tympanic membrane normal.      Left Ear: Tympanic membrane normal.      Mouth/Throat:      Mouth: Mucous membranes are moist.      Pharynx: Oropharynx is clear.   Eyes:      Conjunctiva/sclera: Conjunctivae normal.      Pupils: Pupils are equal, round, and reactive to light.   Cardiovascular:      Rate and Rhythm: Normal rate and regular rhythm.      Pulses: Normal pulses.      Heart sounds: Normal heart sounds. No murmur heard.  Pulmonary:      Effort: Pulmonary effort is  normal. No respiratory distress.      Breath sounds: Normal breath sounds. No wheezing.   Abdominal:      General: There is no distension.      Palpations: Abdomen is soft.      Tenderness: There is no abdominal tenderness.   Musculoskeletal:         General: No swelling. Normal range of motion.      Cervical back: Normal range of motion.   Skin:     General: Skin is warm and dry.   Neurological:      General: No focal deficit present.      Mental Status: She is alert.   Psychiatric:         Behavior: Behavior normal.         HM In-Office Screening:   Patient Readiness - Today's Assessment:  Contemplative  Alcohol Screening Performed?:  Performed, negative  Depression Screening Performed?:  Performed, negative      Assessment/Plan:     Carol Moss is a 36 y.o. female here for annual exam.    [name] was seen today for hypertension.    Diagnoses and all orders for this visit:    Well adult exam  Assessment & Plan:  - Today's body mass index is 39.41 kg/m??. BMI is above normal. A follow-up plan for weight management was discussed with patient.  - BP within normal limits  - Due  for screening labs for co-morbidities  - Discussed recommended vaccines  - Due for cervical cancer screening   - Medications reconciled  - Medical history reviewed and updated in the chart  - Discussed nutrition, physical activity, tobacco/alcohol, sexual health, mood, vision/dental, safety, and age-appropriate preventative care    Orders:  -     Comprehensive metabolic panel; Future  -     Lipid Profile; Future  -     Hemoglobin A1c; Future  -     Hepatitis C Antibody; Future    Edema of both legs  Assessment & Plan:  Well-controlled with HCTZ. Due for labs today.     Orders:  -     hydroCHLOROthiazide (HYDRODIURIL) 25 MG tablet; Take 1 tablet (25 mg total) by mouth daily.  Dispense: 90 tablet; Refill: 3    Elevated blood pressure reading  Assessment & Plan:  Remains normotensive today.     Orders:  -     hydroCHLOROthiazide (HYDRODIURIL) 25 MG tablet; Take 1 tablet (25 mg total) by mouth daily.  Dispense: 90 tablet; Refill: 3    Prediabetes  -     Hemoglobin A1c; Future       Return in about 1 year (around 05/02/2022) for Annual Wellness Visit.    [name], MD  Internal Medicine & Pediatrics

## 2021-05-01 NOTE — Unmapped (Signed)
Addended by: Lincoln MaxinFIORETTI, Ellice Boultinghouse N on: 05/11/2021 10:01 PM     Modules accepted: Level of Service

## 2021-05-11 MED ORDER — hydroCHLOROthiazide (HYDRODIURIL) 25 MG tablet
25 | ORAL_TABLET | Freq: Every day | ORAL | 3 refills | 90.00000 days | Status: AC
Start: 2021-05-11 — End: ?

## 2021-05-11 NOTE — Unmapped (Signed)
Well-controlled with HCTZ. Due for labs today.

## 2021-05-11 NOTE — Unmapped (Signed)
-   Today's body mass index is 39.41 kg/m??. BMI is above normal. A follow-up plan for weight management was discussed with patient.  - BP within normal limits  - Due for screening labs for co-morbidities  - Discussed recommended vaccines  - Due for cervical cancer screening   - Medications reconciled  - Medical history reviewed and updated in the chart  - Discussed nutrition, physical activity, tobacco/alcohol, sexual health, mood, vision/dental, safety, and age-appropriate preventative care

## 2021-05-11 NOTE — Unmapped (Signed)
Remains normotensive today.

## 2022-05-25 MED ORDER — hydroCHLOROthiazide (HYDRODIURIL) 25 MG tablet
25 | ORAL_TABLET | Freq: Every day | ORAL | 0 refills | 90.00000 days | Status: AC
Start: 2022-05-25 — End: ?

## 2022-05-25 NOTE — Telephone Encounter (Signed)
Pharmacy is requesting a refill on medication.    Name of medication: Hydrodiuril    Medication last filled: 05/11/21  Patient's last visit:   Future office visit: Visit date not found         Recent Lab Results:  Vit D, 25-Hydroxy   Date Value Ref Range Status   05/03/2019 41.0 30.0 - 100.0 ng/mL Final     Comment:     Vitamin D deficiency has been defined by the Institute of Medicine (IOM) and an Endocrine Society practice guideline as a level of serum 25- Vitamin D less than 20 ng/mL.    The Endocrine Society went on to further define Vitamin D insufficiency as a level between 21-29 ng/mL.  1)  IOM. 2011 Dietary reference intakes for calcium and D.  [place]: The National Academies Press  2)  Holick MF, [place], Bischoff-Ferrari HA, et al.  Evaluation, treatment, and prevention of Vitamin D deficiency: an Endocrine Society clinical practice guideline.  JCEM.  2011 Jul; 96(7):1911-30.           Lab Results   Component Value Date    HGBA1C 5.9 (H) 05/01/2021       Preferred pharmacy verified with patient:     [facility] [place]  [place] [address]  Phone: [phone] Fax: [phone]

## 2022-09-11 NOTE — Telephone Encounter (Signed)
Patient is requesting a refill on medication.    Name of medication: Hydrochlorothiazide    Medication last filled: 05-25-22  Patient's last visit: 05-01-21  Future office visit: 10/16/2022 [name], MD       Recent Lab Results:  Vit D, 25-Hydroxy   Date Value Ref Range Status   05/03/2019 41.0 30.0 - 100.0 ng/mL Final     Comment:     Vitamin D deficiency has been defined by the Institute of Medicine (IOM) and an Endocrine Society practice guideline as a level of serum 25-[place] Vitamin D less than 20 ng/mL.    The Endocrine Society went on to further define Vitamin D insufficiency as a level between 21-29 ng/mL.  1)  IOM. 2011 Dietary reference intakes for calcium and D.  [place]: [employer] [place]  2)  Holick MF, [place], Bischoff-Ferrari HA, et al.  Evaluation, treatment, and prevention of Vitamin D deficiency: an Endocrine Society clinical practice guideline.  JCEM.  2011 Jul; 96(7):1911-30.           Lab Results   Component Value Date    HGBA1C 5.9 (H) 05/01/2021       Preferred pharmacy verified with patient:       [facility] [address] [place] - [address]  [address] [place] [address]  Phone: [phone] Fax: [phone]

## 2022-09-11 NOTE — Telephone Encounter (Signed)
Pt calling she says she gets a   90 day refills.      Patient calling requesting refill(s) of the following medication(s):  Hydrochlorothiazide  25 mg    She has 3 left and phamacy said no refills.   So need a new perspcription and   She is leaving out of town. On tues and only have 3 left.  Can we get this soon.        Patient would like refill(s) to go to   [facility] [address], [place] - [address]  [address] [place] [address]  Phone: [phone] Fax: [phone]       Last OV:  Visit date not found      Future Appointments   Date Time Provider Department Center   10/16/2022  2:00 PM [name], MD [facility]

## 2022-09-12 MED ORDER — hydroCHLOROthiazide (HYDRODIURIL) 25 MG tablet
25 | ORAL_TABLET | Freq: Every day | ORAL | 0 refills | 90.00000 days
Start: 2022-09-12 — End: 2022-11-06

## 2022-10-16 ENCOUNTER — Ambulatory Visit: Payer: MEDICAID

## 2022-11-06 MED ORDER — hydroCHLOROthiazide (HYDRODIURIL) 25 MG tablet
25 | ORAL_TABLET | Freq: Every day | ORAL | 0 refills | 90.00000 days | Status: AC
Start: 2022-11-06 — End: 2023-01-12

## 2022-11-06 NOTE — Telephone Encounter (Signed)
Pharmacy is requesting a refill on medication.    Name of medication:   Requested Prescriptions     Pending Prescriptions Disp Refills    hydroCHLOROthiazide (HYDRODIURIL) 25 MG tablet [Pharmacy Med Name: hydroCHLOROthiazide 25 MG Oral Tablet] 60 tablet 0     Sig: Take 1 tablet by mouth once daily        Medication last filled: 09/12/22  Patient's last visit: 05/01/21  Future office visit: 01/12/2023 [name], MD         Recent Lab Results:  Vit D, 25-Hydroxy   Date Value Ref Range Status   05/03/2019 41.0 30.0 - 100.0 ng/mL Final     Comment:     Vitamin D deficiency has been defined by the Institute of Medicine (IOM) and an Endocrine Society practice guideline as a level of serum 25- Vitamin D less than 20 ng/mL.    The Endocrine Society went on to further define Vitamin D insufficiency as a level between 21-29 ng/mL.  1)  IOM. 2011 Dietary reference intakes for calcium and D.  [place] [employer] [place]  2)  Holick MF, [place], Bischoff-Ferrari HA, et al.  Evaluation, treatment, and prevention of Vitamin D deficiency: an Endocrine Society clinical practice guideline.  JCEM.  2011 Jul; 96(7):1911-30.           Lab Results   Component Value Date    HGBA1C 5.9 (H) 05/01/2021       Preferred pharmacy verified with patient:     [facility] [address] [place] - [address]  [address] [place] [address]  Phone: [phone] Fax: [phone]

## 2023-01-12 ENCOUNTER — Other Ambulatory Visit: Admit: 2023-01-12 | Payer: MEDICAID

## 2023-01-12 ENCOUNTER — Ambulatory Visit: Admit: 2023-01-12 | Discharge: 2023-01-18 | Payer: MEDICAID

## 2023-01-12 DIAGNOSIS — Z0000 Encounter for general adult medical examination without abnormal findings: Secondary | ICD-10-CM

## 2023-01-12 LAB — LIPID PANEL
Cholesterol, Total: 257 mg/dL — ABNORMAL HIGH (ref 0–200)
HDL: 53 mg/dL — ABNORMAL LOW (ref 60–92)
LDL Cholesterol: 181 mg/dL
Non-HDL Cholesterol, Calculated: 204 mg/dL — ABNORMAL HIGH (ref 0–129)
Triglycerides: 113 mg/dL (ref 10–149)

## 2023-01-12 LAB — COMPREHENSIVE METABOLIC PANEL
ALT: 19 U/L (ref 7–52)
AST: 20 U/L (ref 13–39)
Albumin: 4.6 g/dL (ref 3.5–5.7)
Alkaline Phosphatase: 58 U/L (ref 36–125)
Anion Gap: 12 mmol/L (ref 3–16)
BUN: 9 mg/dL (ref 7–25)
CO2: 28 mmol/L (ref 21–33)
Calcium: 10.1 mg/dL (ref 8.6–10.3)
Chloride: 100 mmol/L (ref 98–110)
Creatinine: 0.81 mg/dL (ref 0.60–1.30)
EGFR: >90
Glucose: 95 mg/dL (ref 70–100)
Osmolality, Calculated: 288 mosm/kg (ref 278–305)
Potassium: 4 mmol/L (ref 3.5–5.3)
Sodium: 140 mmol/L (ref 133–146)
Total Bilirubin: 0.4 mg/dL (ref 0.0–1.5)
Total Protein: 7.8 g/dL (ref 6.4–8.9)

## 2023-01-12 LAB — HEMOGLOBIN A1C: Hemoglobin A1C: 5.5 % (ref 4.0–5.6)

## 2023-01-12 LAB — THYROID FUNCTION CASCADE: TSH: 1.39 u[IU]/mL (ref 0.45–4.12)

## 2023-01-12 MED ORDER — hydroCHLOROthiazide (HYDRODIURIL) 25 MG tablet
25 | ORAL_TABLET | Freq: Every day | ORAL | 3 refills | 90.00000 days | Status: AC
Start: 2023-01-12 — End: ?

## 2023-01-12 MED ORDER — escitalopram oxalate (LEXAPRO) 10 MG tablet
10 | ORAL_TABLET | Freq: Every day | ORAL | 3 refills | Status: AC
Start: 2023-01-12 — End: ?
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
## 2023-01-12 NOTE — Patient Instructions (Addendum)
Mental Health Resources    If you need immediate assistance:    Psychiatric Emergency Services/Mobile Crisis: [phone]  Suicide Crisis Hotline: 206-653-1006  National Suicide Prevention Lifeline -- 1.800.273.TALK (8255)    =====================================================================================================  If you do not need a psychiatrist for medication but want counseling or therapy:    Psychology Today  - [email or web address]    [facility]  - [phone]  [phone] ([facility])    [facility]  [phone], [email or web address]  - Locations in [place], [place], [place], and [place] [employer] [place]  - Multiple office locations in the state of [place] and in [place]  - Go to [email or web address].RefZilla.it or call at [phone] or toll-free number [phone]    [facility]  [phone], [email or web address]  - [address] [place], [place] [address]    [facility]  Main Office: [address], [place], [place] [address]  Tel: [phone]  Fax: [phone]   Satellite Offices:   [address], [place], [place] [address]   [address], [place], [place] [address]    [facility]  Provides individual, couple, and family counseling to children, adolescents and adults in three locations: [place], [place], and [place]. To make an appointment, please call #[phone]    [facility]  [phone]  [email or web address]  [address]  [place], [place] [address]    [facility]  [phone], [email or web address]  - Locations in [place], [place], and [facility] for Psychotherapy  - [address] [phone]  - [email or web address]    [facility]  [phone], [email or web address]   [address]  [place], [place] [address]    [facility]  - [phone] [address] [phone]  - [email or web address]

## 2023-01-17 NOTE — Assessment & Plan Note (Addendum)
Presentation consistent with both depression and anxiety given significant symptoms and impact on daily functioning. Interested in starting medication, so we discussed risks/benefits of various SSRIs. Will plan to start Lexapro 10 mg daily. Use and side effects discussed with patient, including the possibility of worsening symptoms. Counseled that medication can take 4-6 weeks to achieve results. Also recommended counseling/therapy and provided local resources.

## 2023-01-17 NOTE — Assessment & Plan Note (Addendum)
-   Today's body mass index is 40.58 kg/m. BMI is above normal. A follow-up plan for weight management was discussed with patient.  - Due for screening labs for co-morbidities  - Discussed recommended vaccines and preventative screenings  - Medications reconciled  - Medical history reviewed and updated in the chart  - Discussed nutrition, physical activity, tobacco/alcohol, sexual health, mood, vision/dental, safety, and age-appropriate preventative care

## 2023-01-22 NOTE — Telephone Encounter (Signed)
 Form prepped and placed in bin to sign

## 2023-01-22 NOTE — Telephone Encounter (Signed)
 Received forms but they did not include the Certification of Health Care Provider that I need to complete. Notified patient via mychart that correct forms are needed.

## 2023-01-22 NOTE — Telephone Encounter (Signed)
 Patient calling in wanting an update on her MyChart message sent in 11/20 for FMLA.    Patient stated she is currently using sick days at work and today would be her 5th day which means she would need something from her PCP sent about her health she can pr

## 2023-01-25 NOTE — Telephone Encounter (Signed)
 Pt resubmitted proper forms - prepped and placed in bin to sign

## 2023-01-25 NOTE — Telephone Encounter (Signed)
 Paperwork completed and placed in outgoing forms folder.

## 2023-01-25 NOTE — Progress Notes (Signed)
 Pt resubmitted proper forms - prepped and placed in bin to sign

## 2023-01-29 NOTE — Telephone Encounter (Signed)
 Faxed FMLA     Please scan it pt chart and send VIA mychart.

## 2023-02-10 NOTE — Progress Notes (Signed)
 Form prepped and placed in bin to sign

## 2023-02-10 NOTE — Telephone Encounter (Signed)
 Form prepped and placed in bin to sign

## 2023-02-12 NOTE — Telephone Encounter (Signed)
 Paperwork completed and placed in outgoing forms folder.

## 2023-02-13 NOTE — Progress Notes (Signed)
Addressed in separate mychart encounter

## 2023-02-16 NOTE — Progress Notes (Signed)
Forms faxed to pt employer.

## 2023-02-18 NOTE — Telephone Encounter (Signed)
Forms complete. Faxed to pt employer on 12/17

## 2023-10-09 ENCOUNTER — Inpatient Hospital Stay: Admit: 2023-10-09 | Discharge: 2023-10-09 | Disposition: A | Discharge status: Home / Self Care

## 2023-10-09 DIAGNOSIS — F331 Major depressive disorder, recurrent, moderate: Principal | ICD-10-CM

## 2023-10-09 LAB — URINALYSIS W/RFL TO MICROSCOPIC
Bilirubin, UA: NEGATIVE
Blood, UA: NEGATIVE
Glucose, UA: NEGATIVE mg/dL
Ketones, UA: NEGATIVE mg/dL
Leukocyte Esterase, UA: NEGATIVE
Nitrite, UA: NEGATIVE
RBC, UA: 2 /HPF (ref 0–3)
Specific Gravity, UA: 1.028 (ref 1.005–1.035)
Squam Epithel, UA: 9 /HPF — ABNORMAL HIGH (ref 0–5)
Urobilinogen, UA: 2 mg/dL — ABNORMAL HIGH (ref 0.2–1.9)
WBC, UA: 2 /HPF (ref 0–5)
pH, UA: 5.5 (ref 5.0–8.0)

## 2023-10-09 LAB — DIFFERENTIAL
Basophils Absolute: 64 /uL (ref 0–108)
Basophils Relative: 0.7 % (ref 0.0–1.0)
Eosinophils Absolute: 391 /uL (ref 0–864)
Eosinophils Relative: 4.3 % (ref 0.0–8.0)
Lymphocytes Absolute: 3349 /uL (ref 570–4860)
Lymphocytes Relative: 36.8 % (ref 15.0–45.0)
Monocytes Absolute: 883 /uL (ref 0–1296)
Monocytes Relative: 9.7 % (ref 0.0–12.0)
Neutrophils Absolute: 4414 /uL (ref 1520–8640)
Neutrophils Relative: 48.5 % (ref 40.0–80.0)
nRBC: 0 /100{WBCs} (ref 0–0)

## 2023-10-09 LAB — BASIC METABOLIC PANEL
Anion Gap: 12 mmol/L (ref 3–16)
BUN: 10 mg/dL (ref 7–25)
CO2: 26 mmol/L (ref 21–33)
Calcium: 9.6 mg/dL (ref 8.6–10.3)
Chloride: 103 mmol/L (ref 98–110)
Creatinine: 0.83 mg/dL (ref 0.60–1.30)
EGFR: >90
Glucose: 75 mg/dL (ref 70–100)
Osmolality, Calculated: 290 mosm/kg (ref 278–305)
Potassium: 3.6 mmol/L (ref 3.5–5.3)
Sodium: 141 mmol/L (ref 133–146)

## 2023-10-09 LAB — POC MEDTOX DRUG SCREEN, URINE
Amphetamine, 500 ng/mL Cutoff: NEGATIVE
Barbiturates, 200 ng/mL Cutoff: NEGATIVE
Benzodiazepines, 150 ng/mL Cutoff: NEGATIVE
Buprenorphine, 10 ng/mL Cutoff: NEGATIVE
Cocaine metabolite, 150 ng/mL Cutoff: NEGATIVE
Methadone, 200 ng/mL Cutoff: NEGATIVE
Methamphetamine, 500 ng/mL Cutoff: NEGATIVE
Opiates, 100 ng/mL Cutoff: NEGATIVE
Oxycodone, 100 ng/mL Cutoff: NEGATIVE
Phencyclidine, 25 ng/mL Cutoff: NEGATIVE
THC, 50 ng/mL Cutoff: POSITIVE — AB
Tricyclic Antidepressants, 300 ng/mL Cutoff: NEGATIVE

## 2023-10-09 LAB — CBC
Hematocrit: 40.7 % (ref 35.0–45.0)
Hemoglobin: 13.9 g/dL (ref 11.7–15.5)
MCH: 28.5 pg (ref 27.0–33.0)
MCHC: 34.2 g/dL (ref 32.0–36.0)
MCV: 83.5 fL (ref 80.0–100.0)
MPV: 7.6 fL (ref 7.5–11.5)
Platelets: 335 10E3/uL (ref 140–400)
RBC: 4.88 10E6/uL (ref 3.80–5.10)
RDW: 14.8 % (ref 11.0–15.0)
WBC: 9.1 10E3/uL (ref 3.8–10.8)

## 2023-10-09 LAB — HEPATIC FUNCTION PANEL
ALT: 25 U/L (ref 7–52)
AST: 23 U/L (ref 13–39)
Albumin: 4.7 g/dL (ref 3.5–5.7)
Alkaline Phosphatase: 70 U/L (ref 36–125)
Bilirubin, Direct: 0.1 mg/dL (ref 0.00–0.40)
Bilirubin, Indirect: 0.3 mg/dL (ref 0.00–1.10)
Total Bilirubin: 0.4 mg/dL (ref 0.0–1.5)
Total Protein: 8.4 g/dL (ref 6.4–8.9)

## 2023-10-09 LAB — HEMOGLOBIN A1C: Hemoglobin A1C: 6 % — ABNORMAL HIGH (ref 4.0–5.6)

## 2023-10-09 LAB — LIPID PANEL
Cholesterol, Total: 291 mg/dL — ABNORMAL HIGH (ref 0–200)
HDL: 54 mg/dL — ABNORMAL LOW (ref 60–92)
LDL Cholesterol: 208 mg/dL
Non-HDL Cholesterol, Calculated: 237 mg/dL — ABNORMAL HIGH (ref 0–129)
Triglycerides: 147 mg/dL (ref 10–149)

## 2023-10-09 LAB — MEDTOX DRUG SCREEN W/ FENTANYL: Fentanyl, 5 ng/ml Cutoff: NEGATIVE

## 2023-10-09 LAB — TSH: TSH: 1.61 u[IU]/mL (ref 0.45–4.12)
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
## 2023-10-09 NOTE — Discharge Instructions (Addendum)
 Dear Carol Moss,    It has been a pleasure getting to know you. As discussed with you, here are some reminders and additional information.    Please make note of any changes to your medications and continue to take them as prescribed.    If at any time you become concerned that you may harm yourself or others, please call 911 or go to the closest ER. Below are listed some crisis resources.    Emergency Numbers:  National Suicide Prevention Hotline 347-154-3862  Psychiatric Emergency Services      [phone]  Mobile Crisis Team             [phone]    Remember to attend these appointments:  No future appointments.    Thank you for letting us  take part in your care.

## 2023-10-09 NOTE — Progress Notes (Signed)
 Daughter called and asked if she can pick up keys to patients car because the sunroof is open and it is about to start raining. SW asked patient, and patient said that was ok. SW informed daughter.

## 2023-10-09 NOTE — ED Notes (Signed)
 Patient discharged from [facility] with all belongings and AVS with understanding acknowledged.

## 2023-10-09 NOTE — ED Notes (Signed)
 Labs collected awaiting courier pick up.
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
## 2023-10-09 NOTE — ED Triage Notes (Signed)
 38 yo female brought to [facility] by [facility] on hold stating: Left 4 notes to family members apologizing for missing future events, has been under a lot of stress and overwhelmed.     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 Pt tearful, overwhelmed, states she has $3.00 in her bank account, is behind in studies, has been prescribed Lexapro  10 mg but has been out the past few days due to inability to afford. Pt denies SI/HI/AVH at this time and states I'm just so overwhelmed and tired. 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 Pt helped care for her mother in May (who lives out of state [place]) after surgical procedure and has not been able to get caught back up since. Pt states she has to care for her children ages 47 and 7 and tries to help with her 65 year old grandson.  Pt states her sleep and appetite have been poor.
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
## 2023-10-11 NOTE — Progress Notes (Signed)
 Less than 24 hour admission, does not qualify for TCM HDF for PCF

## 2024-01-04 MED ORDER — ESCITALOPRAM 10 MG TABLET
10 | ORAL_TABLET | Freq: Every day | ORAL | 0 refills | 90.00000 days | Status: AC
Start: 2024-01-04 — End: ?

## 2024-01-04 NOTE — Telephone Encounter (Signed)
 Pharmacy is requesting a refill on medication.    Name of medication:   Requested Prescriptions     Pending Prescriptions Disp Refills    escitalopram  oxalate (LEXAPRO ) 10 MG tablet [Pharmacy Med Name: Escitalopram  Oxalate 10 MG Oral Tablet] 90 tablet 0     Sig: Take 1 tablet by mouth once daily        Medication last filled: 01/12/23  Patient's last visit: 01/12/23  Future office visit: Visit date not found       Preferred pharmacy verified with patient:     [facility] [address], [place] - [address]  [address] [place] [address]  Phone: [phone] Fax: [phone]

## 2024-01-04 NOTE — Telephone Encounter (Signed)
 Refilled escitalopram .  Will be due for a visit soon, so no additional refills.

## 2024-02-23 NOTE — Telephone Encounter (Signed)
 Pharmacy is requesting a refill on medication.    Name of medication:   Requested Prescriptions     Pending Prescriptions Disp Refills    hydroCHLOROthiazide  (HYDRODIURIL ) 25 MG tablet [Pharmacy Med Name: hydroCHLOROthiazide  25 MG Oral Tablet] 90 tablet 0     Sig: Take 1 tablet by mouth once daily        Medication last filled: 11/30/2023  Patient's last visit: 01/12/2023  Future office visit: Visit date not found       Preferred pharmacy verified with patient:     [facility] [address], [place] - [address]  [address] [place] [address]  Phone: [phone] Fax: [phone]

## 2024-02-25 MED ORDER — HYDROCHLOROTHIAZIDE 25 MG TABLET
25 | ORAL_TABLET | Freq: Every day | ORAL | 0 refills | 90.00000 days | Status: AC
Start: 2024-02-25 — End: ?

## 2024-02-25 NOTE — Telephone Encounter (Signed)
 One refill sent. Needs to schedule appt since she hasn't been seen in one year.

## 2024-03-01 NOTE — Telephone Encounter (Signed)
 LVM advising pt of providers message
